# Patient Record
Sex: Male | Born: 1950 | Hispanic: No | Marital: Married | Smoking: Never smoker
Health system: Southern US, Community
[De-identification: ages and names within clinical notes are randomized; demographics above are authoritative.]

## PROBLEM LIST (undated history)

## (undated) HISTORY — PX: NO PAST SURGERIES: SHX2092

---

## 2020-11-21 ENCOUNTER — Emergency Department (INDEPENDENT_AMBULATORY_CARE_PROVIDER_SITE_OTHER)
Admission: EM | Admit: 2020-11-21 | Discharge: 2020-11-21 | Disposition: A | Discharge status: Home / Self Care | Payer: PRIVATE HEALTH INSURANCE | Source: Home / Self Care

## 2020-11-21 ENCOUNTER — Other Ambulatory Visit: Payer: Self-pay

## 2020-11-21 ENCOUNTER — Emergency Department (INDEPENDENT_AMBULATORY_CARE_PROVIDER_SITE_OTHER): Payer: PRIVATE HEALTH INSURANCE

## 2020-11-21 DIAGNOSIS — W19XXXA Unspecified fall, initial encounter: Secondary | ICD-10-CM

## 2020-11-21 DIAGNOSIS — M25511 Pain in right shoulder: Secondary | ICD-10-CM

## 2020-11-21 LAB — POCT FASTING CBG KUC MANUAL ENTRY: POCT Glucose (KUC): 185 mg/dL — AB (ref 70–99)

## 2020-11-21 MED ORDER — HYDROCODONE-ACETAMINOPHEN 5-325 MG PO TABS: 1.0000 | ORAL_TABLET | Freq: Four times a day (QID) | ORAL | 0 refills | Status: DC | PRN

## 2020-11-21 NOTE — ED Triage Notes (Signed)
Patient presents to Urgent Care with complaints of right shoulder pain and right elbow painsince falling down 4-5 stairs earlier today. Patient would also like his blood pressure checked because sometimes it is low. Does not think it is the BP that caused the fall, denies dizziness or head trauma. Marland Kitchen

## 2020-11-21 NOTE — Discharge Instructions (Addendum)
Wear sling to keep arm still Try to wear all night Use ice to reduce swelling and pain Take hydrocodone if needed for pain.  This may be necessary for sleep Return tomorrow in the morning for additional evaluation

## 2020-11-21 NOTE — ED Provider Notes (Signed)
[facility]    CSN: [id number] Arrival date & time: 11/21/20  1857      History   Chief Complaint Chief Complaint  Patient presents with  . Fall  . Shoulder Pain    Right    HPI Brandon Cooper is a 70 y.o. male.   HPI   Patient slipped going down stairs and fell.  He was 4 stairs from the top and fell down to the floor.  He does not feel it is because he was dizzy, he thinks he just misstepped.  Landed on the right side.  Has pain in his right shoulder.  Limited shoulder movement. Denies other musculoskeletal pain.  Denies hitting head  sPeaks broken [place].  Is here with his son to interpret  History reviewed. No pertinent past medical history.  There are no problems to display for this patient.   History reviewed. No pertinent surgical history.     Home Medications    Prior to Admission medications   Medication Sig Start Date End Date Taking? Authorizing Provider  HYDROcodone-acetaminophen (NORCO/VICODIN) 5-325 MG tablet Take 1-2 tablets by mouth every 6 (six) hours as needed. 11/21/20  Yes Eustace Moore, MD    Family History Family History  Problem Relation Age of Onset  . Diabetes Mother   . Heart attack Father     Social History Social History   Tobacco Use  . Smoking status: Never Smoker  . Smokeless tobacco: Never Used  Vaping Use  . Vaping Use: Never used  Substance Use Topics  . Alcohol use: Never     Allergies   Patient has no known allergies.   Review of Systems Review of Systems See HPI  Physical Exam Triage Vital Signs ED Triage Vitals  Enc Vitals Group     BP 11/21/20 1918 109/76     Pulse Rate 11/21/20 1918 99     Resp 11/21/20 1918 16     Temp 11/21/20 1918 98.2 F (36.8 C)     Temp Source 11/21/20 1918 Oral     SpO2 11/21/20 1918 99 %     Weight --      Height --      Head Circumference --      Peak Flow --      Pain Score 11/21/20 1915 7     Pain Loc --      Pain Edu? --       Excl. in GC? --    No data found.  Updated Vital Signs BP 109/76 (BP Location: Left Arm)   Pulse 99   Temp 98.2 F (36.8 C) (Oral)   Resp 16   SpO2 99%      Physical Exam Constitutional:      General: He is not in acute distress.    Appearance: He is well-developed and normal weight.  HENT:     Head: Normocephalic and atraumatic.     Nose:     Comments: Mask is in place Eyes:     Conjunctiva/sclera: Conjunctivae normal.     Pupils: Pupils are equal, round, and reactive to light.  Cardiovascular:     Rate and Rhythm: Normal rate.  Pulmonary:     Effort: Pulmonary effort is normal. No respiratory distress.  Abdominal:     General: There is no distension.     Palpations: Abdomen is soft.  Musculoskeletal:     Right shoulder: Tenderness present. No swelling or deformity. Decreased range of motion.  Left shoulder: Normal.     Right elbow: Normal.     Right wrist: Normal.       Arms:     Cervical back: Normal range of motion.  Skin:    General: Skin is warm and dry.  Neurological:     Mental Status: He is alert.      UC Treatments / Results  Labs (all labs ordered are listed, but only abnormal results are displayed) Labs Reviewed  POCT FASTING CBG [facility] MANUAL ENTRY - Abnormal; Notable for the following components:      Result Value   POCT Glucose ([facility]) 185 (*)    All other components within normal limits    EKG   Radiology DG Shoulder Right  Result Date: 11/21/2020 CLINICAL DATA:  Fall, pain EXAM: RIGHT SHOULDER - 2+ VIEW COMPARISON:  None. FINDINGS: There appears to be slight cortical step-off seen within the mid glenoid surface which could represent a slightly impacted fracture. No displaced fractures are seen. Mild overlying soft tissue swelling seen. There is diffuse osteopenia. IMPRESSION: Slight cortical step-off in the mid glenoid surface which could represent a nondisplaced impacted fracture. If further evaluation is required would recommend  dedicated CT. Electronically Signed   By: Jonna Clark M.D.   On: 11/21/2020 19:46    Procedures Procedures (including critical care time)  Medications Ordered in UC Medications - No data to display  Initial Impression / Assessment and Plan / UC Course  I have reviewed the triage vital signs and the nursing notes.  Pertinent labs & imaging results that were available during my care of the patient were reviewed by me and considered in my medical decision making (see chart for details).     Final Clinical Impressions(s) / UC Diagnoses   Final diagnoses:  Acute pain of right shoulder  Fall, initial encounter     Discharge Instructions     Wear sling to keep arm still Try to wear all night Use ice to reduce swelling and pain Take hydrocodone if needed for pain.  This may be necessary for sleep Return tomorrow in the morning for additional evaluation    ED Prescriptions    Medication Sig Dispense Auth. Provider   HYDROcodone-acetaminophen (NORCO/VICODIN) 5-325 MG tablet Take 1-2 tablets by mouth every 6 (six) hours as needed. 10 tablet Eustace Moore, MD     I have reviewed the PDMP during this encounter.   Eustace Moore, MD 11/21/20 [place]

## 2020-11-21 NOTE — ED Notes (Signed)
Patient will return for visit tomorrow for recommended CT scan of fracture. Discharged ambulatory with family.

## 2020-11-22 ENCOUNTER — Emergency Department (INDEPENDENT_AMBULATORY_CARE_PROVIDER_SITE_OTHER)
Admission: EM | Admit: 2020-11-22 | Discharge: 2020-11-22 | Disposition: A | Discharge status: Home / Self Care | Payer: PRIVATE HEALTH INSURANCE | Source: Home / Self Care

## 2020-11-22 ENCOUNTER — Emergency Department (INDEPENDENT_AMBULATORY_CARE_PROVIDER_SITE_OTHER): Payer: PRIVATE HEALTH INSURANCE

## 2020-11-22 DIAGNOSIS — S42151S Displaced fracture of neck of scapula, right shoulder, sequela: Secondary | ICD-10-CM

## 2020-11-22 DIAGNOSIS — S42141S Displaced fracture of glenoid cavity of scapula, right shoulder, sequela: Secondary | ICD-10-CM

## 2020-11-22 DIAGNOSIS — M25511 Pain in right shoulder: Secondary | ICD-10-CM

## 2020-11-22 MED ORDER — HYDROCODONE-ACETAMINOPHEN 5-325 MG PO TABS: 1.0000 | ORAL_TABLET | Freq: Four times a day (QID) | ORAL | 0 refills | Status: AC | PRN

## 2020-11-22 NOTE — ED Provider Notes (Signed)
Patient here for  CT Scan today See yesterdays note for injury details   IMPRESSION: Acute Hill-Sachs and bony Bankart lesions consistent with anterior shoulder dislocation. The shoulder is located on this study.  Complete supraspinatus and near-complete infraspinatus tendon tears with tendon retraction to the level of the glenohumeral joint and moderate to moderately severe fatty atrophy of the muscle bellies, worse in the infraspinatus.  Large volume of fluid in the subacromial/subdeltoid bursa consistent with bursitis.   Electronically Signed   By: Drusilla Kanner M.D.   On: 11/22/2020 11:11  Dt Thekkekandam consulted He agrees to follow patient  Wilfrid Lund, MD 11/22/20 1255

## 2020-11-22 NOTE — ED Notes (Signed)
Pt seen in UC last night. Back today just for CT and possible sports med referral.

## 2020-11-22 NOTE — Discharge Instructions (Addendum)
Continue splint Continue ice for 20 min every few hours See  DR T next week

## 2020-11-27 ENCOUNTER — Ambulatory Visit (INDEPENDENT_AMBULATORY_CARE_PROVIDER_SITE_OTHER): Payer: PRIVATE HEALTH INSURANCE | Admitting: Sports Medicine

## 2020-11-27 ENCOUNTER — Telehealth: Payer: Self-pay

## 2020-11-27 ENCOUNTER — Other Ambulatory Visit: Payer: Self-pay

## 2020-11-27 ENCOUNTER — Ambulatory Visit (INDEPENDENT_AMBULATORY_CARE_PROVIDER_SITE_OTHER): Payer: PRIVATE HEALTH INSURANCE

## 2020-11-27 DIAGNOSIS — S43034A Inferior dislocation of right humerus, initial encounter: Secondary | ICD-10-CM

## 2020-11-27 NOTE — Telephone Encounter (Signed)
This patient and his wife has an appt. for a yearly physical on 11/30/2020. They are scheduled back to back 2:15  [place]/ 2:45 [place]. I was told this would be okay. Just wanted to make sure.

## 2020-11-27 NOTE — Assessment & Plan Note (Signed)
This is a very pleasant 70 year old male, 1 week ago he fell down the stairs, his shoulder was brought into abduction and external rotation and it dislocated and reduced immediately. 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 We did discuss the images with shoulder surgery. He is stable, I am unable to sublux his shoulder on exam today. We will treat conservatively, 1-1/2 more weeks in the sling followed by physical therapy, he does have pain medication to use as needed. I will see him back in 6 weeks, then I will see him back at the 69-month point as well. The pitfall to look out for is anterior luxation of the humeral head to articulate with the coracoid process.  If that happens it will be very difficult to find sufficient bony purchase for a shoulder replacement.

## 2020-11-27 NOTE — Telephone Encounter (Signed)
Always okay, if there is a slot to fill then feel free to fill it, does not matter what is back to back.

## 2020-11-27 NOTE — Telephone Encounter (Signed)
Thank you so much. tvt

## 2020-11-27 NOTE — Progress Notes (Signed)
    Procedures performed today:    None.  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   Brief History, Exam, Impression, and Recommendations:    Dislocation of shoulder, inferior, right, closed This is a very pleasant 70 year old male, 1 week ago he fell down the stairs, his shoulder was brought into abduction and external rotation and it dislocated and reduced immediately. 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 We did discuss the images with shoulder surgery. He is stable, I am unable to sublux his shoulder on exam today. We will treat conservatively, 1-1/2 more weeks in the sling followed by physical therapy, he does have pain medication to use as needed. I will see him back in 6 weeks, then I will see him back at the 53-month point as well. The pitfall to look out for is anterior luxation of the humeral head to articulate with the coracoid process.  If that happens it will be very difficult to find sufficient bony purchase for a shoulder replacement.    ___________________________________________ Ihor Austin. Benjamin Stain, M.D., ABFM., CAQSM. Primary Care and Sports Medicine [facility]  Adjunct Instructor of Family Medicine  [employer]

## 2020-12-04 ENCOUNTER — Other Ambulatory Visit: Payer: Self-pay

## 2020-12-04 ENCOUNTER — Ambulatory Visit (INDEPENDENT_AMBULATORY_CARE_PROVIDER_SITE_OTHER): Payer: Self-pay | Admitting: Physical Therapy

## 2020-12-04 DIAGNOSIS — R29898 Other symptoms and signs involving the musculoskeletal system: Secondary | ICD-10-CM

## 2020-12-04 DIAGNOSIS — M6281 Muscle weakness (generalized): Secondary | ICD-10-CM

## 2020-12-04 DIAGNOSIS — M25511 Pain in right shoulder: Secondary | ICD-10-CM

## 2020-12-04 NOTE — Patient Instructions (Signed)
Access Code: [email or web address] URL: [email or web address] Date: 12/04/2020 Prepared by: Reggy Eye  Exercises Standing Isometric Shoulder Internal Rotation at Doorway - 1 x daily - 7 x weekly - 3 sets - 10 reps Isometric Shoulder External Rotation at Wall - 1 x daily - 7 x weekly - 3 sets - 10 reps Isometric Shoulder Extension at Wall - 1 x daily - 7 x weekly - 3 sets - 10 reps Isometric Shoulder Abduction at Wall - 1 x daily - 7 x weekly - 3 sets - 10 reps Isometric Shoulder Flexion at Wall - 1 x daily - 7 x weekly - 3 sets - 10 reps Supine Shoulder Flexion Extension AAROM with Dowel - 1 x daily - 7 x weekly - 3 sets - 10 reps Supine Shoulder Abduction AAROM with Dowel - 1 x daily - 7 x weekly - 3 sets - 10 reps

## 2020-12-04 NOTE — Therapy (Signed)
[facility] [place] [address] [place], [place], [address] Phone: [phone]   Fax:  [phone]  Physical Therapy Evaluation  Patient Details  Name: Brandon Cooper MRN: [id number] Date of Birth: [birth year] Referring Provider (PT): thekkekandam   Encounter Date: 12/04/2020   PT End of Session - 12/04/20 1006    Visit Number 1    Number of Visits 1    PT Start Time 0930    PT Stop Time 1005    PT Time Calculation (min) 35 min    Activity Tolerance Patient tolerated treatment well    Behavior During Therapy [facility] for tasks assessed/performed           No past medical history on file.  Past Surgical History:  Procedure Laterality Date  . NO PAST SURGERIES      There were no vitals filed for this visit.    Subjective Assessment - 12/04/20 0930    Subjective Pt fell down the stairs 2 weeks ago and dislocated his Rt shoulder. Pt saw MD who diagnosed pt with Rt inferior shoulder dislocation, bankart fracture, hill sachs lesion, OA and chronically torn rotator cuff    Pertinent History Lt clavical fracture    Diagnostic tests x ray    Patient Stated Goals know what exercises to do    Currently in Pain? No/denies              [facility] PT Assessment - 12/04/20 0001      Assessment   Medical Diagnosis closed Rt inferior shoulder dislocation    Referring Provider (PT) [email or web address]      Precautions   Precaution Comments avoid anterior movement of head of humerus      Restrictions   Other Position/Activity Restrictions sling x 1 more week      Balance Screen   Has the patient fallen in the past 6 months No      Prior Function   Level of Independence Independent      Observation/Other Assessments   Focus on Therapeutic Outcomes (FOTO)  48 functional status measure      ROM / Strength   AROM / PROM / Strength AROM;Strength      AROM   Overall AROM Comments Lt shoulder WFL    AROM Assessment Site  Shoulder    Right/Left Shoulder Right    Right Shoulder Flexion 60 Degrees    Right Shoulder ABduction 142 Degrees      Strength   Overall Strength Comments not assessed due to precautions      Palpation   Palpation comment TTP Rt anterior shoulder                      Objective measurements completed on examination: See above findings.       [facility] Adult PT Treatment/Exercise - 12/04/20 0001      Exercises   Exercises Shoulder      Shoulder Exercises: Supine   Other Supine Exercises AAROM flexion and abduction with cane      Shoulder Exercises: Isometric Strengthening   Flexion 5X5"    Extension 5X5"    External Rotation 5X5"    Internal Rotation 5X5"    ABduction 5X5"                  PT Education - 12/04/20 1005    Education Details HEP, education on activity restrictions and modifications    Person(s) Educated Patient;Child(ren)    Methods Explanation;Demonstration;Handout  Comprehension Returned demonstration;Verbalized understanding                       Plan - 12/04/20 1007    Clinical Impression Statement Pt presents with decreasd Rt shoulder strength, ROM and decreased Rt UE functional use. Pt will benefit from skilled PT to address deficits and improve functional abilities. Due to insurance pt prefers eval only. PT educated pt on HEP and activity modifications. Pt with good understanding. Pt aware that he may call with questions regarding HEP    Personal Factors and Comorbidities Behavior Pattern;Social Background    Examination-Activity Limitations Lift;Reach Overhead;Carry;Dressing    Stability/Clinical Decision Making Evolving/Moderate complexity    Clinical Decision Making Moderate    Rehab Potential Good    PT Frequency One time visit    Consulted and Agree with Plan of Care Patient;Family member/caregiver           Patient will benefit from skilled therapeutic intervention in order to improve the following  deficits and impairments:  Pain,Impaired UE functional use,Decreased strength,Decreased range of motion,Decreased activity tolerance  Visit Diagnosis: Acute pain of right shoulder - Plan: PT plan of care cert/re-cert  Muscle weakness (generalized) - Plan: PT plan of care cert/re-cert  Other symptoms and signs involving the musculoskeletal system - Plan: PT plan of care cert/re-cert     Problem List Patient Active Problem List   Diagnosis Date Noted  . Dislocation of shoulder, inferior, right, closed 11/27/2020   Ameya Kutz, PT  Jamelle Noy 12/04/2020, 10:11 AM  [facility] [address] [place], [place], [address] Phone: [phone]   Fax:  [phone]  Name: Keanen Wanamaker MRN: [id number] Date of Birth: [birth year]

## 2021-01-08 ENCOUNTER — Ambulatory Visit (INDEPENDENT_AMBULATORY_CARE_PROVIDER_SITE_OTHER): Payer: PRIVATE HEALTH INSURANCE | Admitting: Sports Medicine

## 2021-01-08 ENCOUNTER — Ambulatory Visit (INDEPENDENT_AMBULATORY_CARE_PROVIDER_SITE_OTHER): Payer: PRIVATE HEALTH INSURANCE

## 2021-01-08 ENCOUNTER — Other Ambulatory Visit: Payer: Self-pay

## 2021-01-08 DIAGNOSIS — S43034D Inferior dislocation of right humerus, subsequent encounter: Secondary | ICD-10-CM

## 2021-01-08 NOTE — Assessment & Plan Note (Signed)
This is a very pleasant 70 year old male, approximately 6 weeks ago he fell down the stairs and dislocated his right shoulder, he felt it come out and then reduced immediately. He was seen in urgent care, ultimately a CT scan showed a bony Bankart fracture as well as a Hill-Sachs lesion all consistent with shoulder dislocation, he does have chronically torn supraspinatus and infraspinatus. We kept him in the sling for a week and a half and then put him into formal physical therapy, he returns today with only minimal pain, he has excellent full range of motion with full abduction, good strength, he has a bit of apprehension with a positive relocation sign. I think the touch of discomfort he has will improve over the next several weeks. 1 additional x-ray today, and he can return to see me as needed. I did have a discussion with shoulder surgery at the last visit, the pitfall to look out for would be anterior luxation of the humeral head to articulate with the coracoid process which would then make it very difficult for sufficient bony purchase for reverse shoulder arthroplasty.

## 2021-01-08 NOTE — Progress Notes (Signed)
    Procedures performed today:    None.  Independent interpretation of notes and tests performed by another provider:   None.  Brief History, Exam, Impression, and Recommendations:    Dislocation of shoulder, inferior, right, closed This is a very pleasant 70 year old male, approximately 6 weeks ago he fell down the stairs and dislocated his right shoulder, he felt it come out and then reduced immediately. He was seen in urgent care, ultimately a CT scan showed a bony Bankart fracture as well as a Hill-Sachs lesion all consistent with shoulder dislocation, he does have chronically torn supraspinatus and infraspinatus. We kept him in the sling for a week and a half and then put him into formal physical therapy, he returns today with only minimal pain, he has excellent full range of motion with full abduction, good strength, he has a bit of apprehension with a positive relocation sign. I think the touch of discomfort he has will improve over the next several weeks. 1 additional x-ray today, and he can return to see me as needed. I did have a discussion with shoulder surgery at the last visit, the pitfall to look out for would be anterior luxation of the humeral head to articulate with the coracoid process which would then make it very difficult for sufficient bony purchase for reverse shoulder arthroplasty.    ___________________________________________ Ihor Austin. Benjamin Stain, M.D., ABFM., CAQSM. Primary Care and Sports Medicine [facility]  Adjunct Instructor of Family Medicine  [employer]

## 2021-07-13 IMAGING — DX DG SHOULDER 2+V*R*
3 series · 3 of 3 positions shown · non-contrast
Comparison: 11/21/2020

CLINICAL DATA: Follow-up shoulder injury.

EXAM:
RIGHT SHOULDER - 2+ VIEW

[shoulder grashey]
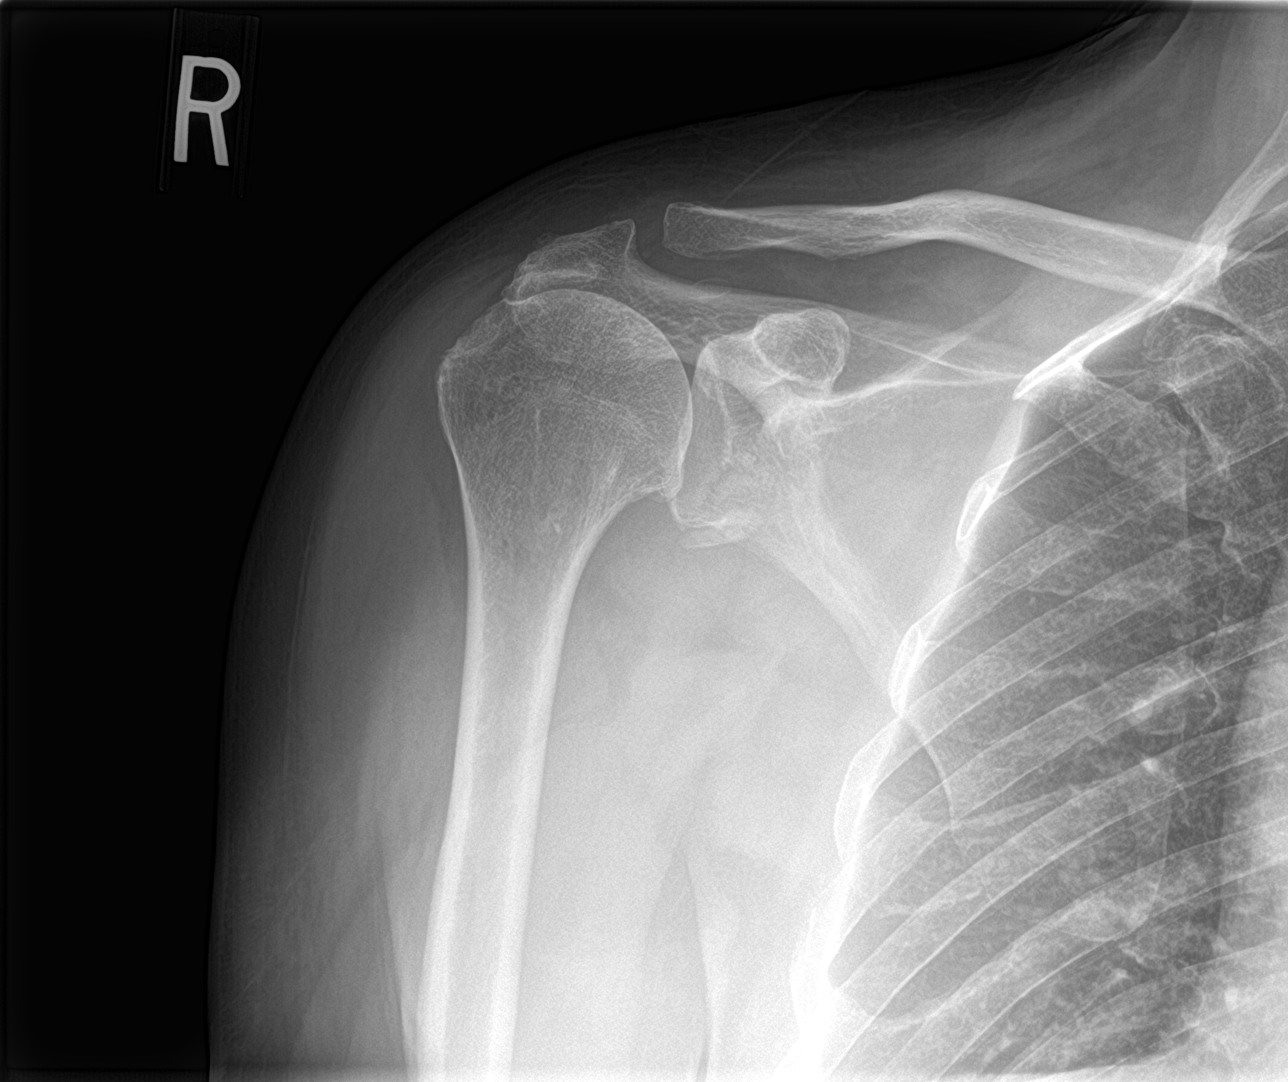

[shoulder y view]
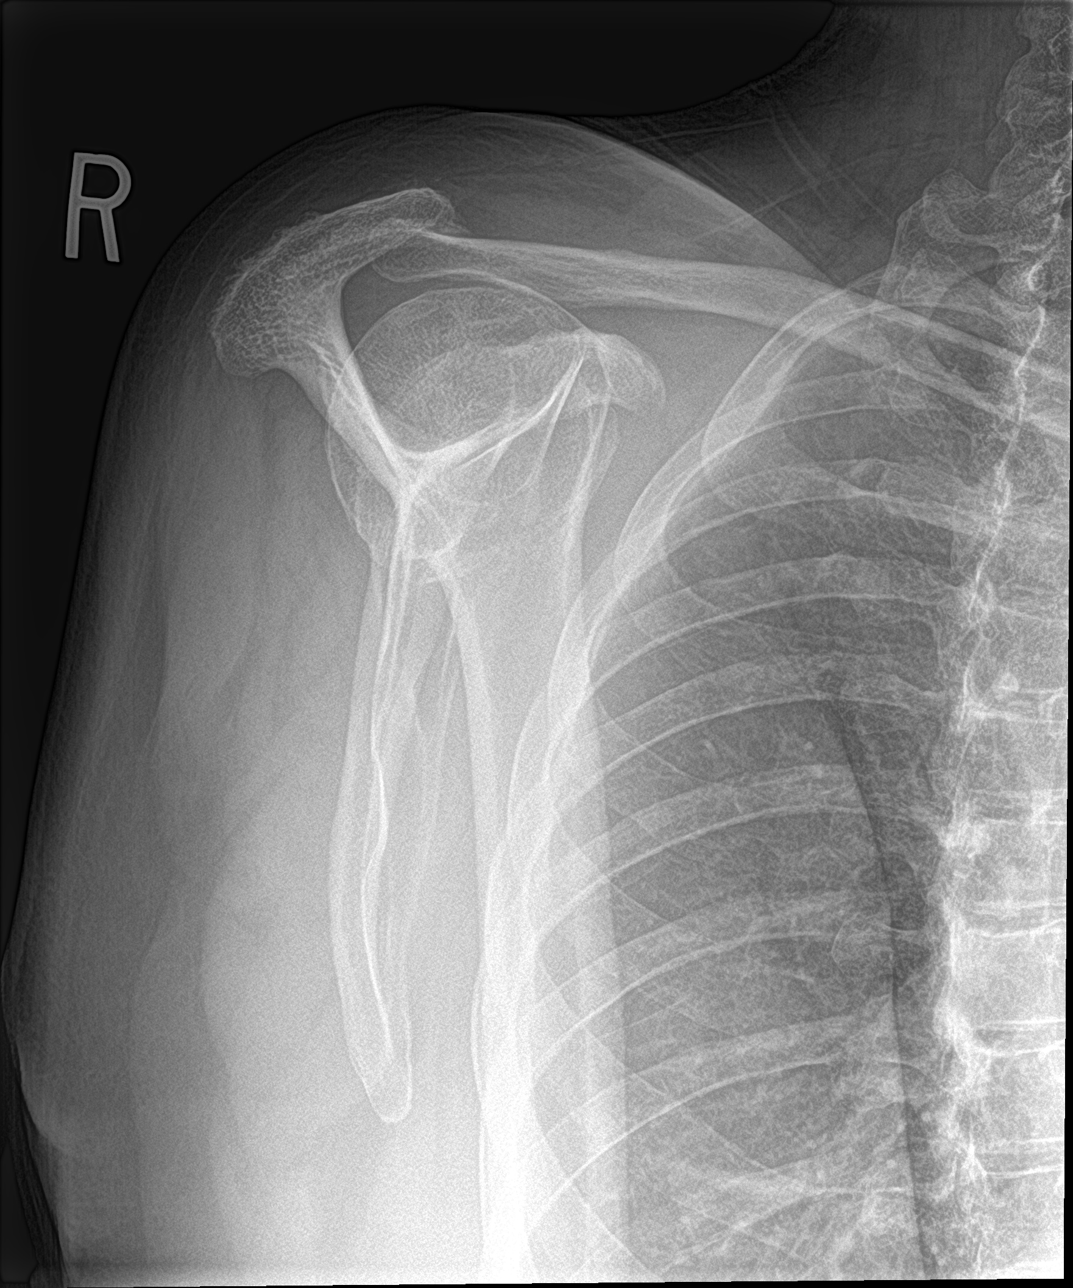

[shoulder axillary]
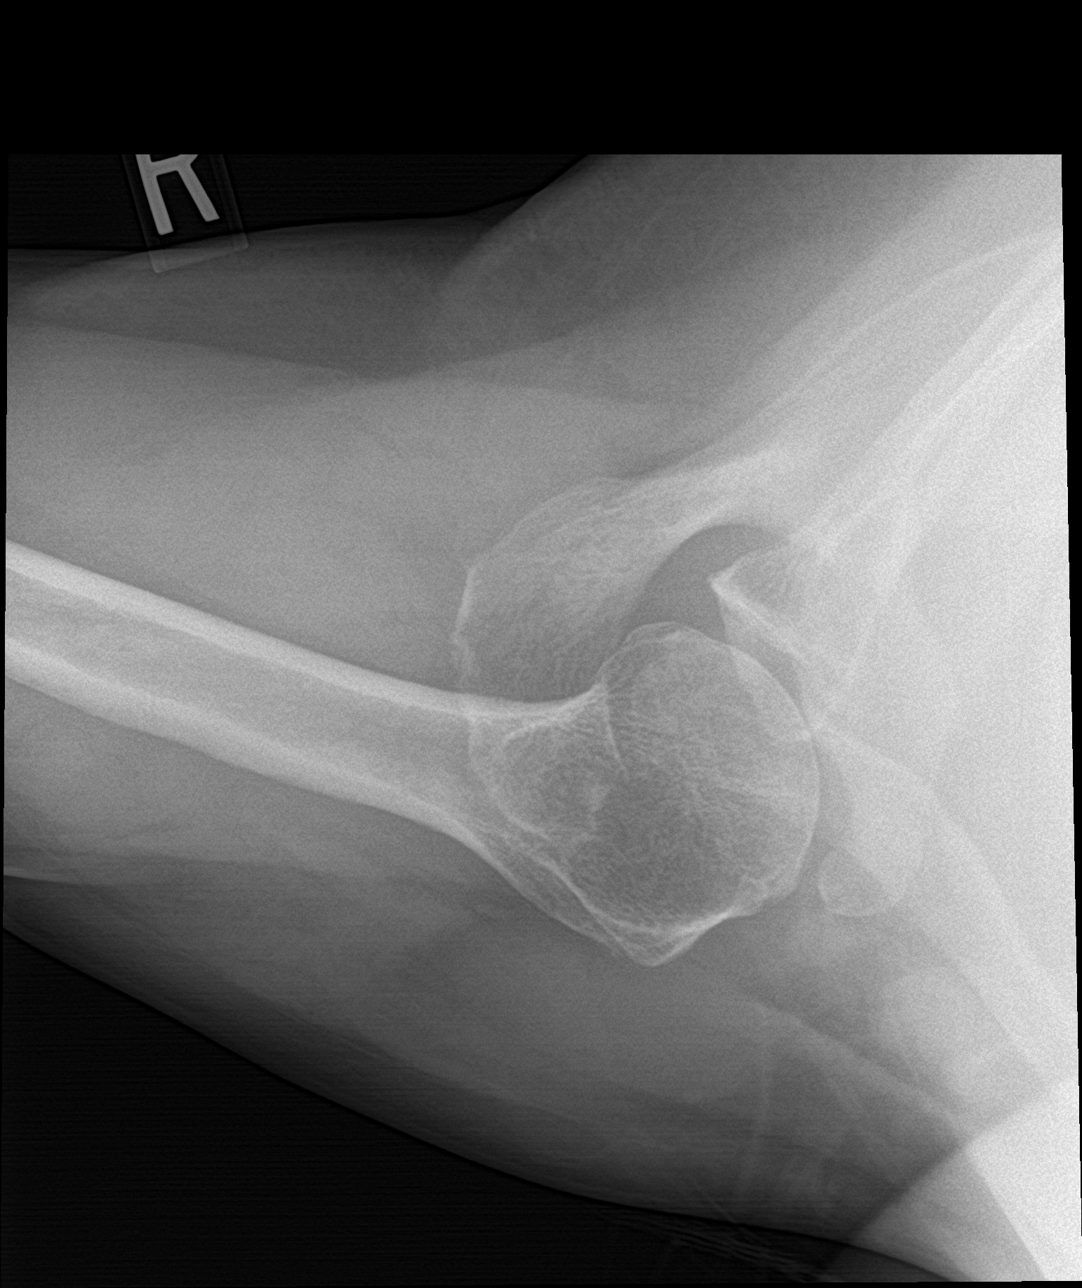

[3 of 3 positions shown; findings below may reference images not displayed]

FINDINGS: Negative for dislocation. There is significant narrowing of the
acromial humeral distance compatible with rotator cuff tear as noted
on CT. Degenerative spurring of the distal acromion.

Fracture of the inferior glenoid, unchanged from the prior CT. Mild
widening of the shoulder joint may be due to effusion or
subluxation.

No acute fracture.
IMPRESSION: Negative for dislocation.

Marked narrowing of the acromial humeral distance compatible with
rotator cuff tear.

Fracture of the inferior glenoid as noted on prior CT.
